# Patient Record
Sex: Female | Born: 1960 | Race: Asian | Hispanic: No | Marital: Married | Smoking: Never smoker
Health system: Southern US, Community
[De-identification: ages and names within clinical notes are randomized; demographics above are authoritative.]

---

## 2017-08-24 ENCOUNTER — Emergency Department (HOSPITAL_BASED_OUTPATIENT_CLINIC_OR_DEPARTMENT_OTHER)
Admission: EM | Admit: 2017-08-24 | Discharge: 2017-08-24 | Disposition: A | Discharge status: Home / Self Care | Payer: Worker's Compensation | Attending: Emergency Medicine | Admitting: Emergency Medicine

## 2017-08-24 ENCOUNTER — Emergency Department (HOSPITAL_BASED_OUTPATIENT_CLINIC_OR_DEPARTMENT_OTHER): Payer: Worker's Compensation

## 2017-08-24 ENCOUNTER — Encounter (HOSPITAL_BASED_OUTPATIENT_CLINIC_OR_DEPARTMENT_OTHER): Payer: Self-pay

## 2017-08-24 DIAGNOSIS — S92514A Nondisplaced fracture of proximal phalanx of right lesser toe(s), initial encounter for closed fracture: Secondary | ICD-10-CM | POA: Insufficient documentation

## 2017-08-24 DIAGNOSIS — W228XXA Striking against or struck by other objects, initial encounter: Secondary | ICD-10-CM | POA: Diagnosis not present

## 2017-08-24 DIAGNOSIS — S99921A Unspecified injury of right foot, initial encounter: Secondary | ICD-10-CM | POA: Diagnosis present

## 2017-08-24 DIAGNOSIS — Y939 Activity, unspecified: Secondary | ICD-10-CM | POA: Insufficient documentation

## 2017-08-24 DIAGNOSIS — Y929 Unspecified place or not applicable: Secondary | ICD-10-CM | POA: Diagnosis not present

## 2017-08-24 DIAGNOSIS — Y990 Civilian activity done for income or pay: Secondary | ICD-10-CM | POA: Insufficient documentation

## 2017-08-24 MED ORDER — IBUPROFEN 800 MG PO TABS: 800.0000 mg | ORAL_TABLET | Freq: Three times a day (TID) | ORAL | 0 refills | Status: AC | PRN

## 2017-08-24 NOTE — Discharge Instructions (Signed)
Return here as needed.  Follow-up with your primary doctor.  Ice and elevate your toe and foot

## 2017-08-24 NOTE — ED Provider Notes (Signed)
[facility]   CSN: [id number] Arrival date & time: 08/24/17  1325     History   Chief Complaint Chief Complaint  Patient presents with  . Toe Injury    HPI Tracey Howell is a 57 y.o. female.  HPI Patient presents to the emergency department with right fifth toe pain that started last week after a desk fell onto her foot.  Patient states that she did not take any medications prior to arrival.  She states movement and palpation makes the pain worse.  She states that has been swelling and bruising over the fifth toe since the injury.  Patient denies any numbness or weakness in the foot History reviewed. No pertinent past medical history.  There are no active problems to display for this patient.   History reviewed. No pertinent surgical history.  OB History    No data available       Home Medications    Prior to Admission medications   Medication Sig Start Date End Date Taking? Authorizing Provider  ibuprofen (ADVIL,MOTRIN) 800 MG tablet Take 1 tablet (800 mg total) by mouth every 8 (eight) hours as needed. 08/24/17   Charlestine Night, PA-C    Family History History reviewed. No pertinent family history.  Social History Social History  Substance Use Topics  . Smoking status: Never Smoker  . Smokeless tobacco: Never Used  . Alcohol use No     Allergies   Patient has no known allergies.   Review of Systems Review of Systems  All other systems negative except as documented in the HPI. All pertinent positives and negatives as reviewed in the HPI. Physical Exam Updated Vital Signs BP 101/78 (BP Location: Left Arm)   Pulse 84   Temp 98.1 F (36.7 C) (Oral)   Resp 16   Ht 5\' 4"  (1.626 m)   Wt 67.1 kg (148 lb)   SpO2 98%   BMI 25.40 kg/m   Physical Exam  Constitutional: She is oriented to person, place, and time. She appears well-developed and well-nourished. No distress.  HENT:  Head: Normocephalic and atraumatic.  Eyes:  Pupils are equal, round, and reactive to light.  Pulmonary/Chest: Effort normal.  Musculoskeletal:       Feet:  Neurological: She is alert and oriented to person, place, and time.  Skin: Skin is warm and dry.  Psychiatric: She has a normal mood and affect.  Nursing note and vitals reviewed.    ED Treatments / Results  Labs (all labs ordered are listed, but only abnormal results are displayed) Labs Reviewed - No data to display  EKG  EKG Interpretation None       Radiology Dg Toe 5th Right  Result Date: 08/24/2017 CLINICAL DATA:  Blunt trauma with pain for several days, initial encounter EXAM: RIGHT FIFTH TOE COMPARISON:  None. FINDINGS: There is a transverse undisplaced fracture through the distal aspect of the fifth proximal phalanx. No gross soft tissue abnormality is seen. No other bony abnormality is noted. IMPRESSION: Fifth proximal phalangeal fracture without significant displacement. Electronically Signed   By: Alcide Clever M.D.   On: 08/24/2017 14:10    Procedures Procedures (including critical care time)  Medications Ordered in ED Medications - No data to display   Initial Impression / Assessment and Plan / ED Course  I have reviewed the triage vital signs and the nursing notes.  Pertinent labs & imaging results that were available during my care of the patient were reviewed by me  and considered in my medical decision making (see chart for details).    Patient retreated for fracture of her fifth toe to return here as needed.  Patient agrees the plan and all questions were answered  Final Clinical Impressions(s) / ED Diagnoses   Final diagnoses:  Closed nondisplaced fracture of proximal phalanx of lesser toe of right foot, initial encounter    New Prescriptions New Prescriptions   IBUPROFEN (ADVIL,MOTRIN) 800 MG TABLET    Take 1 tablet (800 mg total) by mouth every 8 (eight) hours as needed.     Charlestine Night, PA-C 08/26/17 1600    Tilden Fossa, MD 08/28/17 [phone]

## 2017-08-24 NOTE — ED Triage Notes (Signed)
PT reports workers comp injury on Tuesday - states injury to right 5th toe - states a desk fell onto it. Employed by [employer]. Denies that they require UDS.

## 2019-04-04 IMAGING — DX DG TOE 5TH 2+V*R*
3 series · 3 of 3 positions shown · non-contrast
Comparison: None.

CLINICAL DATA: Blunt trauma with pain for several days, initial
encounter

EXAM:
RIGHT FIFTH TOE

[toe ap]
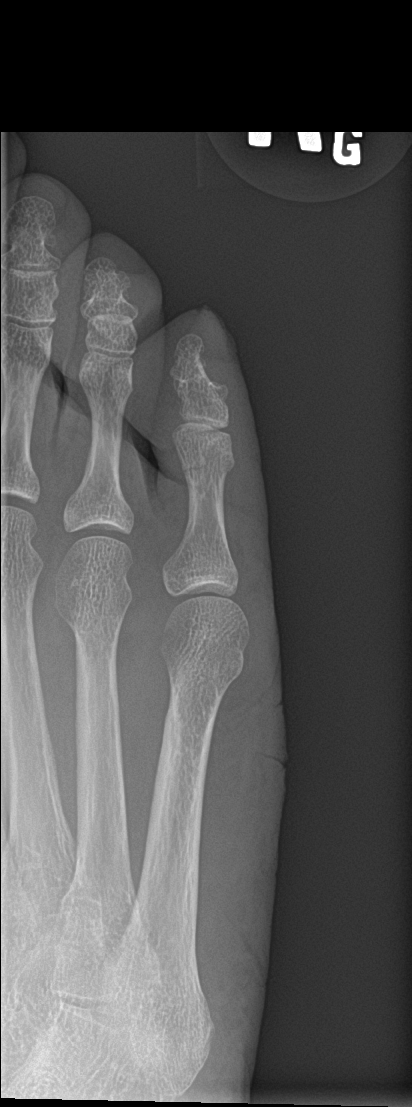

[toe obl]
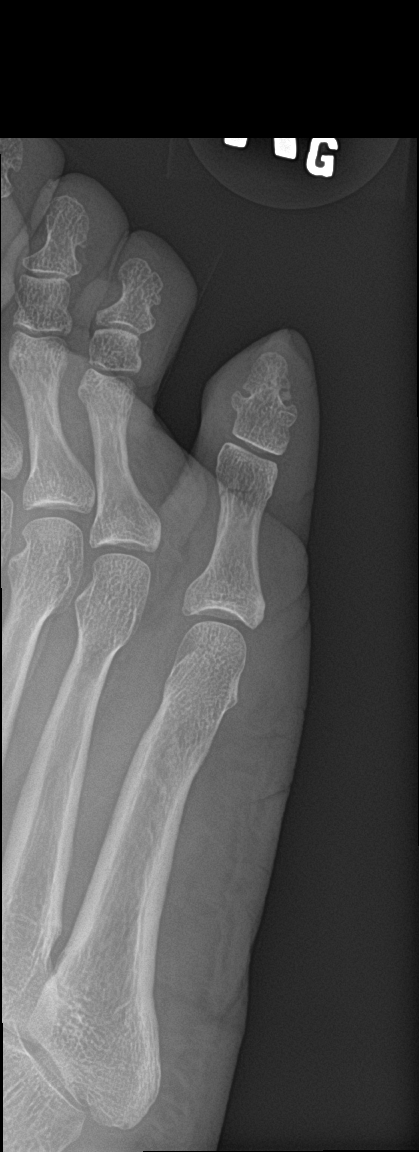

[toe lat]
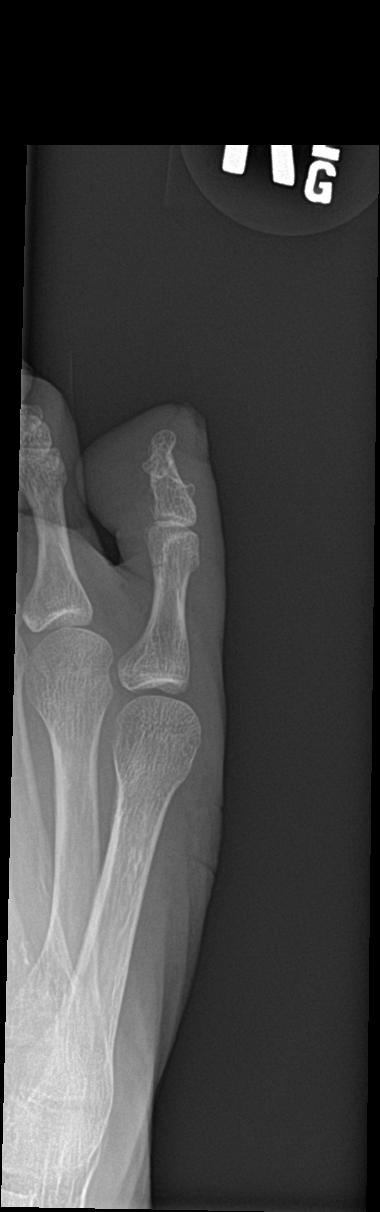

[3 of 3 positions shown; findings below may reference images not displayed]

FINDINGS: There is a transverse undisplaced fracture through the distal aspect
of the fifth proximal phalanx. No gross soft tissue abnormality is
seen. No other bony abnormality is noted.
IMPRESSION: Fifth proximal phalangeal fracture without significant displacement.
